# Patient Record
Sex: Male | Born: 1990 | Race: Black or African American | Hispanic: No | Marital: Single
Health system: Southern US, Community
[De-identification: ages and names within clinical notes are randomized; demographics above are authoritative.]

---

## 2019-05-24 ENCOUNTER — Emergency Department (HOSPITAL_COMMUNITY): Payer: Self-pay

## 2019-05-24 ENCOUNTER — Encounter (HOSPITAL_COMMUNITY): Payer: Self-pay | Admitting: Emergency Medicine

## 2019-05-24 ENCOUNTER — Other Ambulatory Visit: Payer: Self-pay

## 2019-05-24 ENCOUNTER — Emergency Department (HOSPITAL_COMMUNITY)
Admission: EM | Admit: 2019-05-24 | Discharge: 2019-05-24 | Disposition: A | Discharge status: Home / Self Care | Payer: Self-pay | Attending: Emergency Medicine | Admitting: Emergency Medicine

## 2019-05-24 DIAGNOSIS — Y9301 Activity, walking, marching and hiking: Secondary | ICD-10-CM | POA: Insufficient documentation

## 2019-05-24 DIAGNOSIS — S93492A Sprain of other ligament of left ankle, initial encounter: Secondary | ICD-10-CM

## 2019-05-24 DIAGNOSIS — X500XXA Overexertion from strenuous movement or load, initial encounter: Secondary | ICD-10-CM | POA: Insufficient documentation

## 2019-05-24 DIAGNOSIS — W19XXXA Unspecified fall, initial encounter: Secondary | ICD-10-CM

## 2019-05-24 DIAGNOSIS — Y929 Unspecified place or not applicable: Secondary | ICD-10-CM | POA: Insufficient documentation

## 2019-05-24 DIAGNOSIS — Y999 Unspecified external cause status: Secondary | ICD-10-CM | POA: Insufficient documentation

## 2019-05-24 NOTE — Discharge Instructions (Signed)
Your x-ray shows no evidence of fracture and I suspect you have sprained your left ankle, use brace and crutches, take ibuprofen 600 mg every 6 hours and Tylenol as needed.  Ice and elevate the ankle.  If symptoms are not improving over the next week please follow-up with Dr. Ranell Patrick with orthopedics.

## 2019-05-24 NOTE — ED Provider Notes (Signed)
[facility] Provider Note   CSN: [id number] Arrival date & time: 05/24/19  1510     History Chief Complaint  Patient presents with  . Ankle Injury    Trevor Sweeney is a 29 y.o. male.  Trevor Sweeney is a 29 y.o. male who is otherwise healthy, presents to the ED for evaluation of left ankle injury.  He reports he slipped on ice today and rolled his left ankle.  He denies any other injuries from the incident.  Reports since the injury his ankle has become increasingly swollen and he is unable to bear weight on it.  He reports when he first slipped he felt like he tweaked his left knee a bit but that feels back to normal now he continues to have pain and swelling over the left ankle.  He has not taken anything to treat pain prior to arrival.  Denies previous injury or surgery to the ankle.  Denies numbness or weakness.         History reviewed. No pertinent past medical history.  There are no problems to display for this patient.   History reviewed. No pertinent surgical history.     No family history on file.  Social History   Tobacco Use  . Smoking status: Not on file  Substance Use Topics  . Alcohol use: Not on file  . Drug use: Not on file    Home Medications Prior to Admission medications   Not on File    Allergies    Patient has no allergy information on record.  Review of Systems   Review of Systems  Constitutional: Negative for chills and fever.  Musculoskeletal: Positive for arthralgias and joint swelling.  Skin: Negative for color change and rash.    Physical Exam Updated Vital Signs BP (!) 151/112 (BP Location: Left Arm)   Pulse (!) 119   Temp 98.8 F (37.1 C) (Oral)   Resp 16   SpO2 99%   Physical Exam Vitals and nursing note reviewed.  Constitutional:      General: He is not in acute distress.    Appearance: Normal appearance. He is well-developed. He is not ill-appearing or  diaphoretic.  HENT:     Head: Normocephalic and atraumatic.  Eyes:     General:        Right eye: No discharge.        Left eye: No discharge.  Pulmonary:     Effort: Pulmonary effort is normal. No respiratory distress.  Musculoskeletal:     Comments: There is swelling and tenderness over the lateral malleolus.No overt deformity. No tenderness over the medial aspect of the ankle. The fifth metatarsal is not tender. The ankle joint is intact without excessive opening on stressing.  2+ DP and PT pulses.  Skin:    General: Skin is warm and dry.  Neurological:     Mental Status: He is alert and oriented to person, place, and time.     Coordination: Coordination normal.  Psychiatric:        Mood and Affect: Mood normal.        Behavior: Behavior normal.     ED Results / Procedures / Treatments   Labs (all labs ordered are listed, but only abnormal results are displayed) Labs Reviewed - No data to display  EKG None  Radiology DG Ankle Complete Left  Result Date: 05/24/2019 CLINICAL DATA:  Sub 10 fell, lateral ankle pain EXAM: LEFT ANKLE COMPLETE -  3+ VIEW COMPARISON:  None. FINDINGS: Frontal, oblique, and lateral views of the left ankle are obtained. No acute displaced fracture. Alignment is anatomic. Prominent lateral soft tissue swelling. IMPRESSION: 1. Lateral soft tissue swelling.  No acute fracture. Electronically Signed   By: Sharlet Salina M.D.   On: 05/24/2019 16:00    Procedures Procedures (including critical care time)  Medications Ordered in ED Medications - No data to display  ED Course  I have reviewed the triage vital signs and the nursing notes.  Pertinent labs & imaging results that were available during my care of the patient were reviewed by me and considered in my medical decision making (see chart for details).    MDM Rules/Calculators/A&P                      Presentation consistent with ankle sprain. Tenderness and swelling over lateral malleolus, pt  is neurovascularly intact, and x-ray negative for fracture, and shows ankle mortise is intact.  Patient declined pain treatment in the ED, noted to be tachycardic on arrival but this was resolved on my exam with normal palpated pulse. Pt placed in ASO brace and provided crutches, ambulated without difficulty. Pt stable for discharge home with ibuprofen for pain. Pt to follow-up with ortho in one week if symptoms not improving. Return precautions discussed, Pt expresses understanding and agrees with plan.  Final Clinical Impression(s) / ED Diagnoses Final diagnoses:  Sprain of anterior talofibular ligament of left ankle, initial encounter    Rx / DC Orders ED Discharge Orders    None       Legrand Rams 05/24/19 2032    Cathren Laine, MD 05/25/19 [phone]

## 2019-05-24 NOTE — ED Notes (Signed)
Pt discharge, crutches, and follow up education provided. Pt is alert and oriented x 4 and ambulatory at discharge. Pt verbalized understanding.

## 2019-05-24 NOTE — ED Triage Notes (Signed)
Pt reports slipping on ice today and injured left ankle. Swelling noted to ankle. Pt unable to bear weight on it.

## 2020-10-05 IMAGING — CR DG ANKLE COMPLETE 3+V*L*
3 series · 3 of 3 positions shown · non-contrast
Comparison: None.

CLINICAL DATA: Sub 10 fell, lateral ankle pain

EXAM:
LEFT ANKLE COMPLETE - 3+ VIEW

[ankle ap]
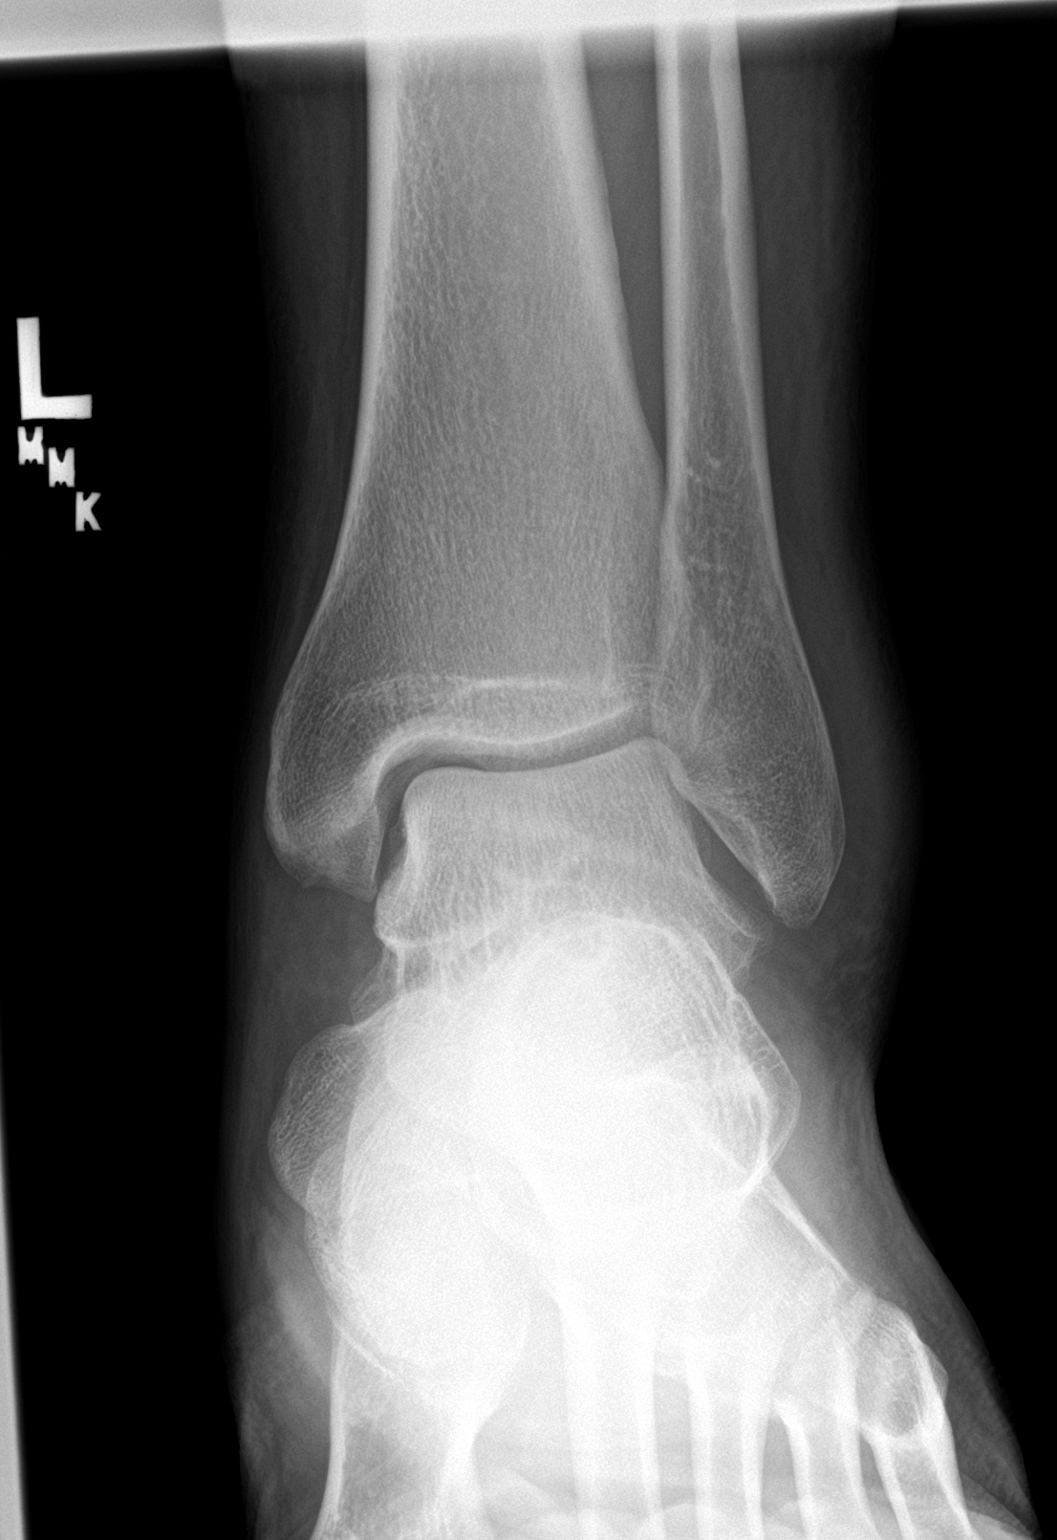

[ankle obl]
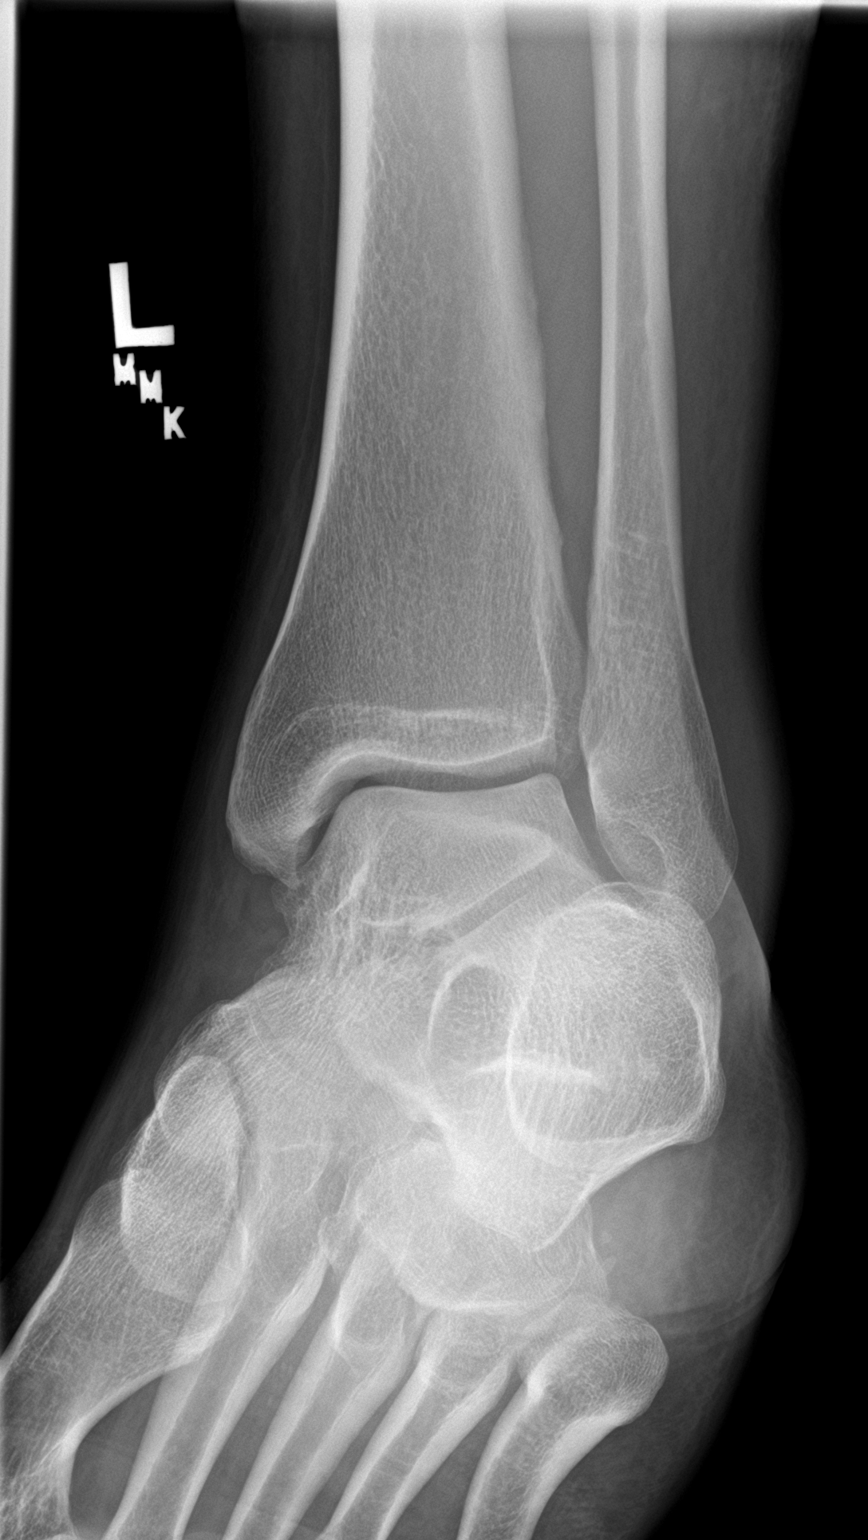

[ankle lat]
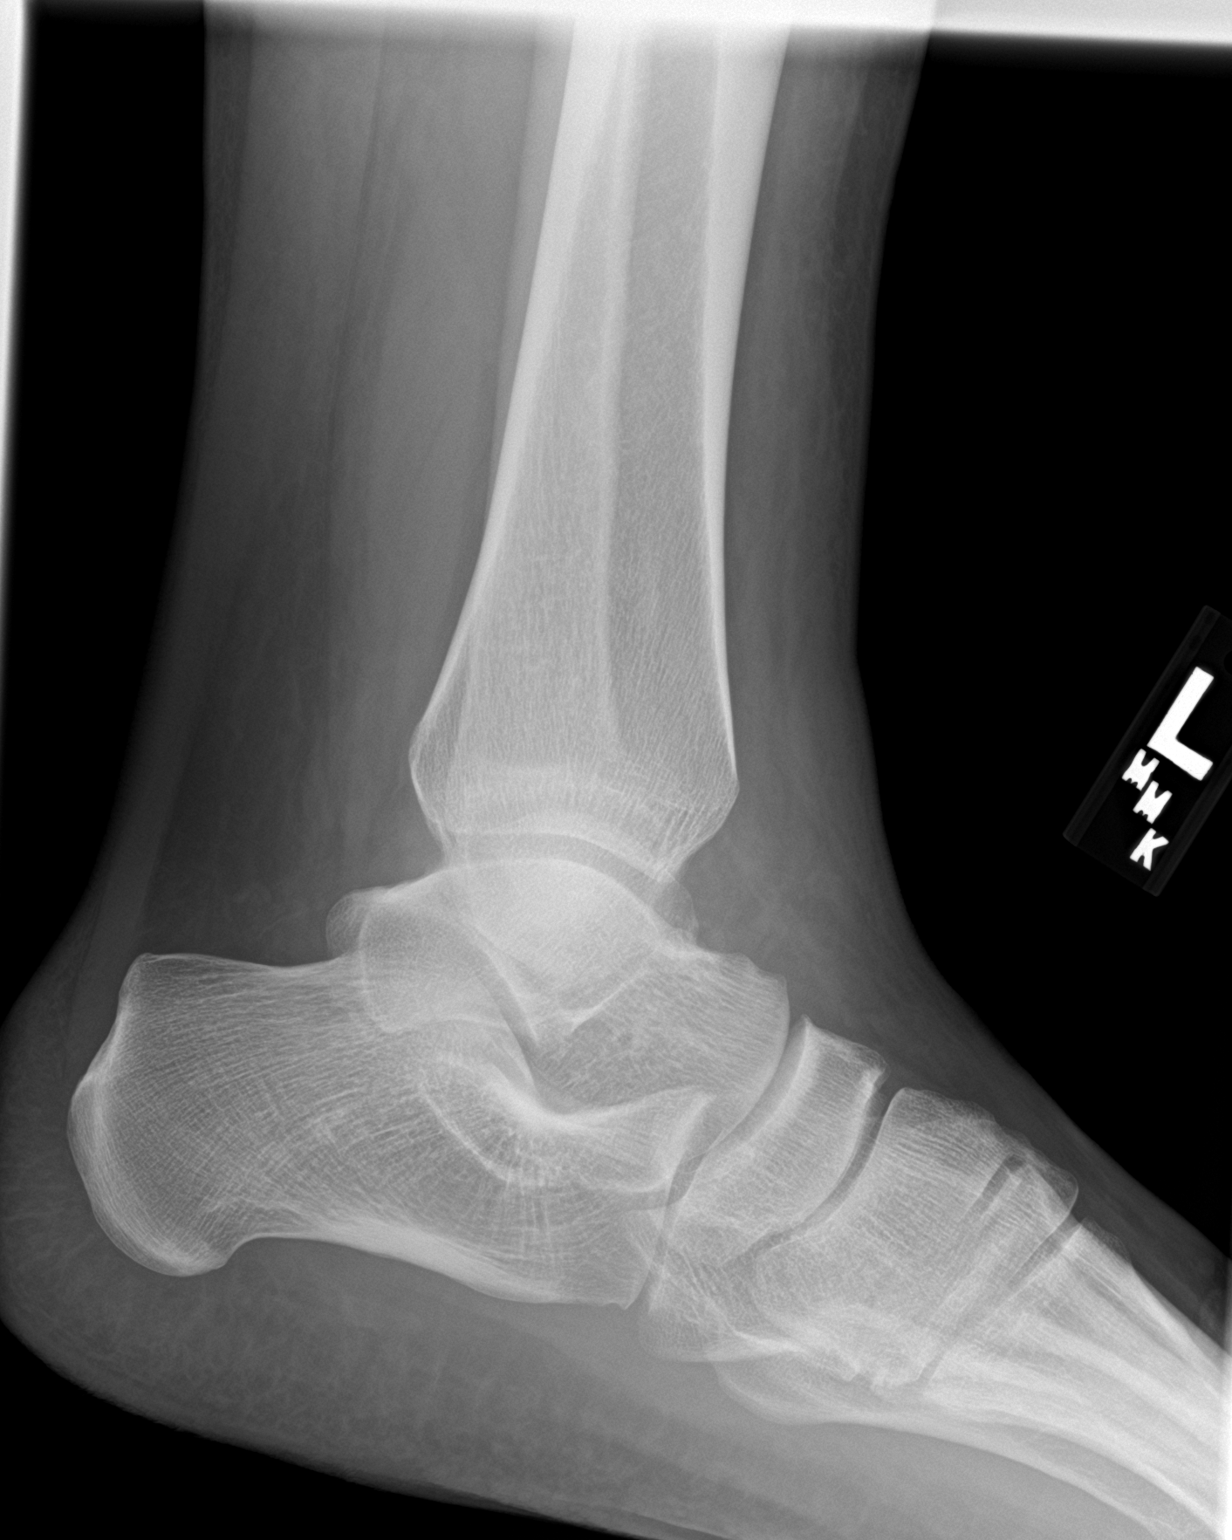

[3 of 3 positions shown; findings below may reference images not displayed]

FINDINGS: Frontal, oblique, and lateral views of the left ankle are obtained.
No acute displaced fracture. Alignment is anatomic. Prominent
lateral soft tissue swelling.
IMPRESSION: 1. Lateral soft tissue swelling.  No acute fracture.
